# Patient Record
Sex: Male | Born: 1995 | Race: White | Hispanic: No | Marital: Married | Smoking: Current every day smoker
Health system: Southern US, Community
[De-identification: ages and names within clinical notes are randomized; demographics above are authoritative.]

## PROBLEM LIST (undated history)

## (undated) HISTORY — PX: WISDOM TOOTH EXTRACTION: SHX21

---
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
## 2014-05-21 ENCOUNTER — Emergency Department (HOSPITAL_COMMUNITY): Payer: Self-pay

## 2014-05-21 ENCOUNTER — Emergency Department (HOSPITAL_COMMUNITY)
Admission: EM | Admit: 2014-05-21 | Discharge: 2014-05-21 | Disposition: A | Discharge status: Home / Self Care | Payer: Self-pay | Attending: Emergency Medicine | Admitting: Emergency Medicine

## 2014-05-21 ENCOUNTER — Encounter (HOSPITAL_COMMUNITY): Payer: Self-pay | Admitting: Emergency Medicine

## 2014-05-21 DIAGNOSIS — Z720 Tobacco use: Secondary | ICD-10-CM | POA: Insufficient documentation

## 2014-05-21 DIAGNOSIS — R05 Cough: Secondary | ICD-10-CM | POA: Insufficient documentation

## 2014-05-21 DIAGNOSIS — R0789 Other chest pain: Secondary | ICD-10-CM | POA: Insufficient documentation

## 2014-05-21 DIAGNOSIS — R059 Cough, unspecified: Secondary | ICD-10-CM

## 2014-05-21 MED ORDER — CETIRIZINE-PSEUDOEPHEDRINE ER 5-120 MG PO TB12: 1.0000 | ORAL_TABLET | Freq: Every day | ORAL | Status: DC

## 2014-05-21 MED ORDER — NAPROXEN 500 MG PO TABS: 500.0000 mg | ORAL_TABLET | Freq: Two times a day (BID) | ORAL | Status: DC

## 2014-05-21 MED ORDER — FLUTICASONE PROPIONATE 50 MCG/ACT NA SUSP: 2.0000 | Freq: Every day | NASAL | Status: AC

## 2014-05-21 NOTE — ED Provider Notes (Signed)
CSN: [id number]     Arrival date & time 05/21/14  1900 History  This chart was scribed for non-physician practitioner, [name], PA-C working with Glynn OctaveStephen Rancour, MD by Greggory StallionKayla Andersen, ED scribe. This patient was seen in room TR11C/TR11C and the patient's care was started at 8:12 PM.    Chief Complaint  Patient presents with  . Cough   The history is provided by the patient. No language interpreter was used.   HPI Comments: Richard Combs is a 18 y.o. male who presents to the Emergency Department complaining of worsening non productive cough and intermittent SOB that started one month ago. States he has sharp substernal chest pains with coughing episodes. He has history of acid reflux and states this doesn't feel similar. Reports nasal congestion that started one week ago. Denies fever, nausea, emesis. Denies history of asthma. Pt smokes cigarettes daily. Denies leg pain, swelling, recent travel or surgery. No hx of cancer.  No CAD.  No hx of blood clots.    History reviewed. No pertinent past medical history. History reviewed. No pertinent past surgical history. No family history on file. History  Substance Use Topics  . Smoking status: Current Every Day Smoker  . Smokeless tobacco: Not on file  . Alcohol Use: No    Review of Systems  Constitutional: Negative for fever.  HENT: Positive for congestion.   Respiratory: Positive for cough and shortness of breath.   Cardiovascular: Positive for chest pain.  Gastrointestinal: Negative for nausea and vomiting.  All other systems reviewed and are negative.  Allergies  Review of patient's allergies indicates no known allergies.  Home Medications   Prior to Admission medications   Medication Sig Start Date End Date Taking? Authorizing Provider  cetirizine-pseudoephedrine (ZYRTEC-D) 5-120 MG per tablet Take 1 tablet by mouth daily. 05/21/14   [name], PA-C  fluticasone (FLONASE) 50 MCG/ACT nasal spray Place 2 sprays into  both nostrils daily. 05/21/14   [name], PA-C  naproxen (NAPROSYN) 500 MG tablet Take 1 tablet (500 mg total) by mouth 2 (two) times daily. 05/21/14   [name], PA-C   BP 145/76  Pulse 78  Temp(Src) 98.4 F (36.9 C) (Oral)  Resp 19  Ht 5\' 7"  (1.702 m)  Wt 207 lb (93.895 kg)  BMI 32.41 kg/m2  SpO2 98%  Physical Exam  Nursing note and vitals reviewed. Constitutional: He is oriented to person, place, and time. He appears well-developed and well-nourished.  HENT:  Head: Normocephalic and atraumatic.  Mouth/Throat: Oropharynx is clear and moist.  Eyes: EOM are normal.  Neck: Normal range of motion.  Cardiovascular: Normal rate, regular rhythm and normal heart sounds.   Pulmonary/Chest: Effort normal and breath sounds normal. No respiratory distress. He has no wheezes. He has no rhonchi. He has no rales.  Musculoskeletal: Normal range of motion.  Neurological: He is alert and oriented to person, place, and time.  Skin: Skin is warm and dry.  Psychiatric: He has a normal mood and affect. His behavior is normal.    ED Course  Procedures (including critical care time)  DIAGNOSTIC STUDIES: Oxygen Saturation is 100% on RA, normal by my interpretation.    COORDINATION OF CARE: 8:13 PM-Discussed treatment plan which includes chest xray with pt at bedside and pt agreed to plan.   Labs Review Labs Reviewed - No data to display  Imaging Review Dg Chest 2 View  05/21/2014   CLINICAL DATA:  Initial evaluation for cough for 1 month, getting worse, shortness  of breath, cough is nonproductive  EXAM: CHEST  2 VIEW  COMPARISON:  None.  FINDINGS: The heart size and mediastinal contours are within normal limits. Both lungs are clear. The visualized skeletal structures are unremarkable.  IMPRESSION: No active cardiopulmonary disease.   Electronically Signed   By: Esperanza Heiraymond  Rubner M.D.   On: 05/21/2014 21:32     EKG Interpretation None      MDM   Final diagnoses:  Cough  Chest  wall pain    Pt c/o cough with intermittent SOB, centralized chest pain with cough x1 month. Also c/o nasal congestion. On exam, no acute distress, no respiratory distress. Lungs: CTAB. Abd: soft, non-distended, non-tender. No significant PMH. PERC negative.  CXR: no active cardiopulmonary disease.  Will tx for cough with Flonase and zyrtec, may be cough from post-nasal drip due to nasal congestion and duration.  Advised to f/u with PCP in 1-2 weeks for recheck of symptoms. Return precautions provided. Pt verbalized understanding and agreement with tx plan.   I personally performed the services described in this documentation, which was scribed in my presence. The recorded information has been reviewed and is accurate.  [name], PA-C 05/21/14 2218

## 2014-05-21 NOTE — ED Notes (Signed)
Patient transported to X-ray 

## 2014-05-21 NOTE — ED Notes (Signed)
PA at bedside.

## 2014-05-21 NOTE — ED Notes (Signed)
C/o non-productive cough and sob x 1 month.  Reports nasal congestion x 1 week.

## 2014-05-21 NOTE — ED Notes (Signed)
Pt reports cough for one month. Denies congestion. Does report pain with taking deep breath to lower epigastric area.

## 2014-05-21 NOTE — Discharge Instructions (Signed)
Chest Wall Pain °Chest wall pain is pain felt in or around the chest bones and muscles. It may take up to 6 weeks to get better. It may take longer if you are active. Chest wall pain can happen on its own. Other times, things like germs, injury, coughing, or exercise can cause the pain. °HOME CARE  °· Avoid activities that make you tired or cause pain. Try not to use your chest, belly (abdominal), or side muscles. Do not use heavy weights. °· Put ice on the sore area. °¨ Put ice in a plastic bag. °¨ Place a towel between your skin and the bag. °¨ Leave the ice on for 15-20 minutes for the first 2 days. °· Only take medicine as told by your doctor. °GET HELP RIGHT AWAY IF:  °· You have more pain or are very uncomfortable. °· You have a fever. °· Your chest pain gets worse. °· You have new problems. °· You feel sick to your stomach (nauseous) or throw up (vomit). °· You start to sweat or feel lightheaded. °· You have a cough with mucus (phlegm). °· You cough up blood. °MAKE SURE YOU:  °· Understand these instructions. °· Will watch your condition. °· Will get help right away if you are not doing well or get worse. °Document Released: 01/07/2008 Document Revised: 10/13/2011 Document Reviewed: 03/17/2011 °ExitCare® Patient Information ©2015 [employer]. This information is not intended to replace advice given to you by your health care provider. Make sure you discuss any questions you have with your health care provider. ° °

## 2014-05-22 NOTE — ED Provider Notes (Signed)
Medical screening examination/treatment/procedure(s) were performed by non-physician practitioner and as supervising physician I was immediately available for consultation/collaboration.   EKG Interpretation None        Cierra Rothgeb, MD 05/22/14 0003 

## 2014-07-21 ENCOUNTER — Emergency Department (HOSPITAL_COMMUNITY)
Admission: EM | Admit: 2014-07-21 | Discharge: 2014-07-21 | Disposition: A | Discharge status: Home / Self Care | Payer: Self-pay | Attending: Emergency Medicine | Admitting: Emergency Medicine

## 2014-07-21 ENCOUNTER — Encounter (HOSPITAL_COMMUNITY): Payer: Self-pay | Admitting: Emergency Medicine

## 2014-07-21 ENCOUNTER — Emergency Department (HOSPITAL_COMMUNITY): Payer: Self-pay

## 2014-07-21 DIAGNOSIS — Z720 Tobacco use: Secondary | ICD-10-CM | POA: Insufficient documentation

## 2014-07-21 DIAGNOSIS — J40 Bronchitis, not specified as acute or chronic: Secondary | ICD-10-CM | POA: Insufficient documentation

## 2014-07-21 MED ORDER — HYDROCOD POLST-CHLORPHEN POLST 10-8 MG/5ML PO LQCR: 5.0000 mL | Freq: Every evening | ORAL | Status: AC | PRN

## 2014-07-21 MED ORDER — ALBUTEROL SULFATE HFA 108 (90 BASE) MCG/ACT IN AERS
2.0000 | INHALATION_SPRAY | RESPIRATORY_TRACT | Status: DC | PRN
  Administered 2014-07-21: 2 via RESPIRATORY_TRACT
  Filled 2014-07-21: qty 6.7

## 2014-07-21 MED ORDER — NAPROXEN 500 MG PO TABS: 500.0000 mg | ORAL_TABLET | Freq: Two times a day (BID) | ORAL | Status: AC

## 2014-07-21 MED ORDER — CETIRIZINE-PSEUDOEPHEDRINE ER 5-120 MG PO TB12: 1.0000 | ORAL_TABLET | Freq: Every day | ORAL | Status: AC

## 2014-07-21 NOTE — Discharge Instructions (Signed)
Smoking Cessation °Quitting smoking is important to your health and has many advantages. However, it is not always easy to quit since nicotine is a very addictive drug. Oftentimes, people try 3 times or more before being able to quit. This document explains the best ways for you to prepare to quit smoking. Quitting takes hard work and a lot of effort, but you can do it. °ADVANTAGES OF QUITTING SMOKING °· You will live longer, feel better, and live better. °· Your body will feel the impact of quitting smoking almost immediately. °¨ Within 20 minutes, blood pressure decreases. Your pulse returns to its normal level. °¨ After 8 hours, carbon monoxide levels in the blood return to normal. Your oxygen level increases. °¨ After 24 hours, the chance of having a heart attack starts to decrease. Your breath, hair, and body stop smelling like smoke. °¨ After 48 hours, damaged nerve endings begin to recover. Your sense of taste and smell improve. °¨ After 72 hours, the body is virtually free of nicotine. Your bronchial tubes relax and breathing becomes easier. °¨ After 2 to 12 weeks, lungs can hold more air. Exercise becomes easier and circulation improves. °· The risk of having a heart attack, stroke, cancer, or lung disease is greatly reduced. °¨ After 1 year, the risk of coronary heart disease is cut in half. °¨ After 5 years, the risk of stroke falls to the same as a nonsmoker. °¨ After 10 years, the risk of lung cancer is cut in half and the risk of other cancers decreases significantly. °¨ After 15 years, the risk of coronary heart disease drops, usually to the level of a nonsmoker. °· If you are pregnant, quitting smoking will improve your chances of having a healthy baby. °· The people you live with, especially any children, will be healthier. °· You will have extra money to spend on things other than cigarettes. °QUESTIONS TO THINK ABOUT BEFORE ATTEMPTING TO QUIT °You may want to talk about your answers with your  health care provider. °· Why do you want to quit? °· If you tried to quit in the past, what helped and what did not? °· What will be the most difficult situations for you after you quit? How will you plan to handle them? °· Who can help you through the tough times? Your family? Friends? A health care provider? °· What pleasures do you get from smoking? What ways can you still get pleasure if you quit? °Here are some questions to ask your health care provider: °· How can you help me to be successful at quitting? °· What medicine do you think would be best for me and how should I take it? °· What should I do if I need more help? °· What is smoking withdrawal like? How can I get information on withdrawal? °GET READY °· Set a quit date. °· Change your environment by getting rid of all cigarettes, ashtrays, matches, and lighters in your home, car, or work. Do not let people smoke in your home. °· Review your past attempts to quit. Think about what worked and what did not. °GET SUPPORT AND ENCOURAGEMENT °You have a better chance of being successful if you have help. You can get support in many ways. °· Tell your family, friends, and coworkers that you are going to quit and need their support. Ask them not to smoke around you. °· Get individual, group, or telephone counseling and support. Programs are available at local hospitals and health centers. Call   your local health department for information about programs in your area. °· Spiritual beliefs and practices may help some smokers quit. °· Download a "quit meter" on your computer to keep track of quit statistics, such as how long you have gone without smoking, cigarettes not smoked, and money saved. °· Get a self-help book about quitting smoking and staying off tobacco. °LEARN NEW SKILLS AND BEHAVIORS °· Distract yourself from urges to smoke. Talk to someone, go for a walk, or occupy your time with a task. °· Change your normal routine. Take a different route to work.  Drink tea instead of coffee. Eat breakfast in a different place. °· Reduce your stress. Take a hot bath, exercise, or read a book. °· Plan something enjoyable to do every day. Reward yourself for not smoking. °· Explore interactive web-based programs that specialize in helping you quit. °GET MEDICINE AND USE IT CORRECTLY °Medicines can help you stop smoking and decrease the urge to smoke. Combining medicine with the above behavioral methods and support can greatly increase your chances of successfully quitting smoking. °· Nicotine replacement therapy helps deliver nicotine to your body without the negative effects and risks of smoking. Nicotine replacement therapy includes nicotine gum, lozenges, inhalers, nasal sprays, and skin patches. Some may be available over-the-counter and others require a prescription. °· Antidepressant medicine helps people abstain from smoking, but how this works is unknown. This medicine is available by prescription. °· Nicotinic receptor partial agonist medicine simulates the effect of nicotine in your brain. This medicine is available by prescription. °Ask your health care provider for advice about which medicines to use and how to use them based on your health history. Your health care provider will tell you what side effects to look out for if you choose to be on a medicine or therapy. Carefully read the information on the package. Do not use any other product containing nicotine while using a nicotine replacement product.  °RELAPSE OR DIFFICULT SITUATIONS °Most relapses occur within the first 3 months after quitting. Do not be discouraged if you start smoking again. Remember, most people try several times before finally quitting. You may have symptoms of withdrawal because your body is used to nicotine. You may crave cigarettes, be irritable, feel very hungry, cough often, get headaches, or have difficulty concentrating. The withdrawal symptoms are only temporary. They are strongest  when you first quit, but they will go away within 10-14 days. °To reduce the chances of relapse, try to: °· Avoid drinking alcohol. Drinking lowers your chances of successfully quitting. °· Reduce the amount of caffeine you consume. Once you quit smoking, the amount of caffeine in your body increases and can give you symptoms, such as a rapid heartbeat, sweating, and anxiety. °· Avoid smokers because they can make you want to smoke. °· Do not let weight gain distract you. Many smokers will gain weight when they quit, usually less than 10 pounds. Eat a healthy diet and stay active. You can always lose the weight gained after you quit. °· Find ways to improve your mood other than smoking. °FOR MORE INFORMATION  °www.smokefree.gov  °Document Released: 07/15/2001 Document Revised: 12/05/2013 Document Reviewed: 10/30/2011 °ExitCare® Patient Information ©2015 [employer]. This information is not intended to replace advice given to you by your health care provider. Make sure you discuss any questions you have with your health care provider. °Acute Bronchitis °Bronchitis is inflammation of the airways that extend from the windpipe into the lungs (bronchi). The inflammation often causes mucus   to develop. This leads to a cough, which is the most common symptom of bronchitis.  °In acute bronchitis, the condition usually develops suddenly and goes away over time, usually in a couple weeks. Smoking, allergies, and asthma can make bronchitis worse. Repeated episodes of bronchitis may cause further lung problems.  °CAUSES °Acute bronchitis is most often caused by the same virus that causes a cold. The virus can spread from person to person (contagious) through coughing, sneezing, and touching contaminated objects. °SIGNS AND SYMPTOMS  °· Cough.   °· Fever.   °· Coughing up mucus.   °· Body aches.   °· Chest congestion.   °· Chills.   °· Shortness of breath.   °· Sore throat.   °DIAGNOSIS  °Acute bronchitis is usually diagnosed  through a physical exam. Your health care provider will also ask you questions about your medical history. Tests, such as chest X-rays, are sometimes done to rule out other conditions.  °TREATMENT  °Acute bronchitis usually goes away in a couple weeks. Oftentimes, no medical treatment is necessary. Medicines are sometimes given for relief of fever or cough. Antibiotic medicines are usually not needed but may be prescribed in certain situations. In some cases, an inhaler may be recommended to help reduce shortness of breath and control the cough. A cool mist vaporizer may also be used to help thin bronchial secretions and make it easier to clear the chest.  °HOME CARE INSTRUCTIONS °· Get plenty of rest.   °· Drink enough fluids to keep your urine clear or pale yellow (unless you have a medical condition that requires fluid restriction). Increasing fluids may help thin your respiratory secretions (sputum) and reduce chest congestion, and it will prevent dehydration.   °· Take medicines only as directed by your health care provider. °· If you were prescribed an antibiotic medicine, finish it all even if you start to feel better. °· Avoid smoking and secondhand smoke. Exposure to cigarette smoke or irritating chemicals will make bronchitis worse. If you are a smoker, consider using nicotine gum or skin patches to help control withdrawal symptoms. Quitting smoking will help your lungs heal faster.   °· Reduce the chances of another bout of acute bronchitis by washing your hands frequently, avoiding people with cold symptoms, and trying not to touch your hands to your mouth, nose, or eyes.   °· Keep all follow-up visits as directed by your health care provider.   °SEEK MEDICAL CARE IF: °Your symptoms do not improve after 1 week of treatment.  °SEEK IMMEDIATE MEDICAL CARE IF: °· You develop an increased fever or chills.   °· You have chest pain.   °· You have severe shortness of breath. °· You have bloody sputum.   °· You  develop dehydration. °· You faint or repeatedly feel like you are going to pass out. °· You develop repeated vomiting. °· You develop a severe headache. °MAKE SURE YOU:  °· Understand these instructions. °· Will watch your condition. °· Will get help right away if you are not doing well or get worse. °Document Released: 08/28/2004 Document Revised: 12/05/2013 Document Reviewed: 01/11/2013 °ExitCare® Patient Information ©2015 [employer]. This information is not intended to replace advice given to you by your health care provider. Make sure you discuss any questions you have with your health care provider. ° °

## 2014-07-21 NOTE — ED Notes (Addendum)
Pt transported from home c/o productive cough, pt states he was dx with PNA a month ago but did not get meds filled. Pt requesting evaluation  Pt states he did get better from 10/18 visit but s/s returned 11/12. Now  c/o cough, with sore throat.

## 2014-07-21 NOTE — ED Provider Notes (Signed)
CSN: [id number]     Arrival date & time 07/21/14  2107 History  This chart was scribed for Elpidio AnisShari Anntoinette Haefele, PA-C with Purvis SheffieldForrest Harrison, MD by Tonye RoyaltyJoshua Chen, ED Scribe. This patient was seen in room WTR9/WTR9 and the patient's care was started at 11:19 PM.    Chief Complaint  Patient presents with  . Cough   The history is provided by the patient. No language interpreter was used.    HPI Comments: Richard Combs is a 18 y.o. male who presents to the Emergency Department complaining of productive cough with onset 2 weeks ago, worsening gradually. He reports associated congestion in his neck and chest. He denies fever but states he feels hot. He reports prior evaluation here 1 month ago at which time he was diagnosed with pneumonia, after which he improved. He states he has not been taking any medications for his symptoms. He states he has been smoking for 1 year. He denies having similar recurrent symptoms every year. He states his girlfriend is sick with pneumonia, but denies other sick contacts. He states he works as a [employer].   History reviewed. No pertinent past medical history. Past Surgical History  Procedure Laterality Date  . Wisdom tooth extraction     No family history on file. History  Substance Use Topics  . Smoking status: Current Every Day Smoker  . Smokeless tobacco: Not on file  . Alcohol Use: No    Review of Systems  Constitutional: Negative for fever.  HENT: Positive for congestion.   Respiratory: Positive for cough.       Allergies  Review of patient's allergies indicates no known allergies.  Home Medications   Prior to Admission medications   Medication Sig Start Date End Date Taking? Authorizing Provider  cetirizine-pseudoephedrine (ZYRTEC-D) 5-120 MG per tablet Take 1 tablet by mouth daily. 05/21/14   [name], PA-C  fluticasone (FLONASE) 50 MCG/ACT nasal spray Place 2 sprays into both nostrils daily. 05/21/14   [name], PA-C  naproxen  (NAPROSYN) 500 MG tablet Take 1 tablet (500 mg total) by mouth 2 (two) times daily. 05/21/14   [name], PA-C   BP 145/79 mmHg  Pulse 93  Temp(Src) 98.3 F (36.8 C) (Oral)  Resp 16  Ht 5\' 8"  (1.727 m)  Wt 170 lb (77.111 kg)  BMI 25.85 kg/m2  SpO2 99% Physical Exam  Constitutional: He is oriented to person, place, and time. He appears well-developed and well-nourished. No distress.  HENT:  Head: Normocephalic and atraumatic.  Eyes: Conjunctivae are normal.  Cardiovascular: Normal rate and regular rhythm.   No murmur heard. Pulmonary/Chest: Effort normal and breath sounds normal. No respiratory distress. He has no wheezes. He has no rales.  Neurological: He is alert and oriented to person, place, and time.  Psychiatric: He has a normal mood and affect.  Nursing note and vitals reviewed.   ED Course  Procedures (including critical care time) DIAGNOSTIC STUDIES: Oxygen Saturation is 99% on room air, normal by my interpretation.    COORDINATION OF CARE: 11:21 PM Discussed with patient that his chest x-ray reveals no evidence of pneumonia, and his current symptoms are likely viral.   Labs Review Labs Reviewed - No data to display  Imaging Review Dg Chest 2 View  07/21/2014   CLINICAL DATA:  Chest pain and cough for 2 months. History of smoking.  EXAM: CHEST  2 VIEW  COMPARISON:  05/21/2014.  FINDINGS: The heart size and mediastinal contours are within normal limits. Both  lungs are clear. The visualized skeletal structures are unremarkable.  IMPRESSION: No active cardiopulmonary disease.  Stable chest.   Electronically Signed   By: Davonna BellingJohn  Curnes M.D.   On: 07/21/2014 22:37     EKG Interpretation None      MDM   Final diagnoses:  None    1. Bronchitis 2. Tobacco abuse  He is well appearing, afebrile, normal oxygenation. He can be discharged home with supportive care measure recommended.  I personally performed the services described in this documentation, which  was scribed in my presence. The recorded information has been reviewed and is accurate.     Arnoldo HookerShari A Wasif Simonich, PA-C 07/23/14 [id number]  Purvis SheffieldForrest Harrison, MD 07/23/14 [phone]

## 2015-06-04 ENCOUNTER — Emergency Department (HOSPITAL_COMMUNITY): Admission: EM | Admit: 2015-06-04 | Discharge: 2015-06-04 | Discharge status: Absent without leave | Payer: Self-pay

## 2015-06-04 NOTE — ED Notes (Signed)
Called patient to triage and no answer.

## 2015-06-04 NOTE — ED Notes (Signed)
Called patient to triage. Unable to locate patient at this time... 

## 2015-06-04 NOTE — ED Notes (Signed)
Triage documented in error, pt no answer x triage.

## 2015-06-04 NOTE — ED Notes (Deleted)
Pt from home for eval generalized abd pain since Saturday and nausea, pt states she was seen on Saturday for same and given phenergen rx with no relief. Denies any vaginal discharge, or urinary symptoms. Pt hunched over in triage.

## 2015-06-21 IMAGING — CR DG CHEST 2V
2 series · 2 of 2 positions shown · non-contrast
Comparison: 05/21/2014.

CLINICAL DATA: Chest pain and cough for 2 months. History of
smoking.

EXAM:
CHEST  2 VIEW

[w chest pa]
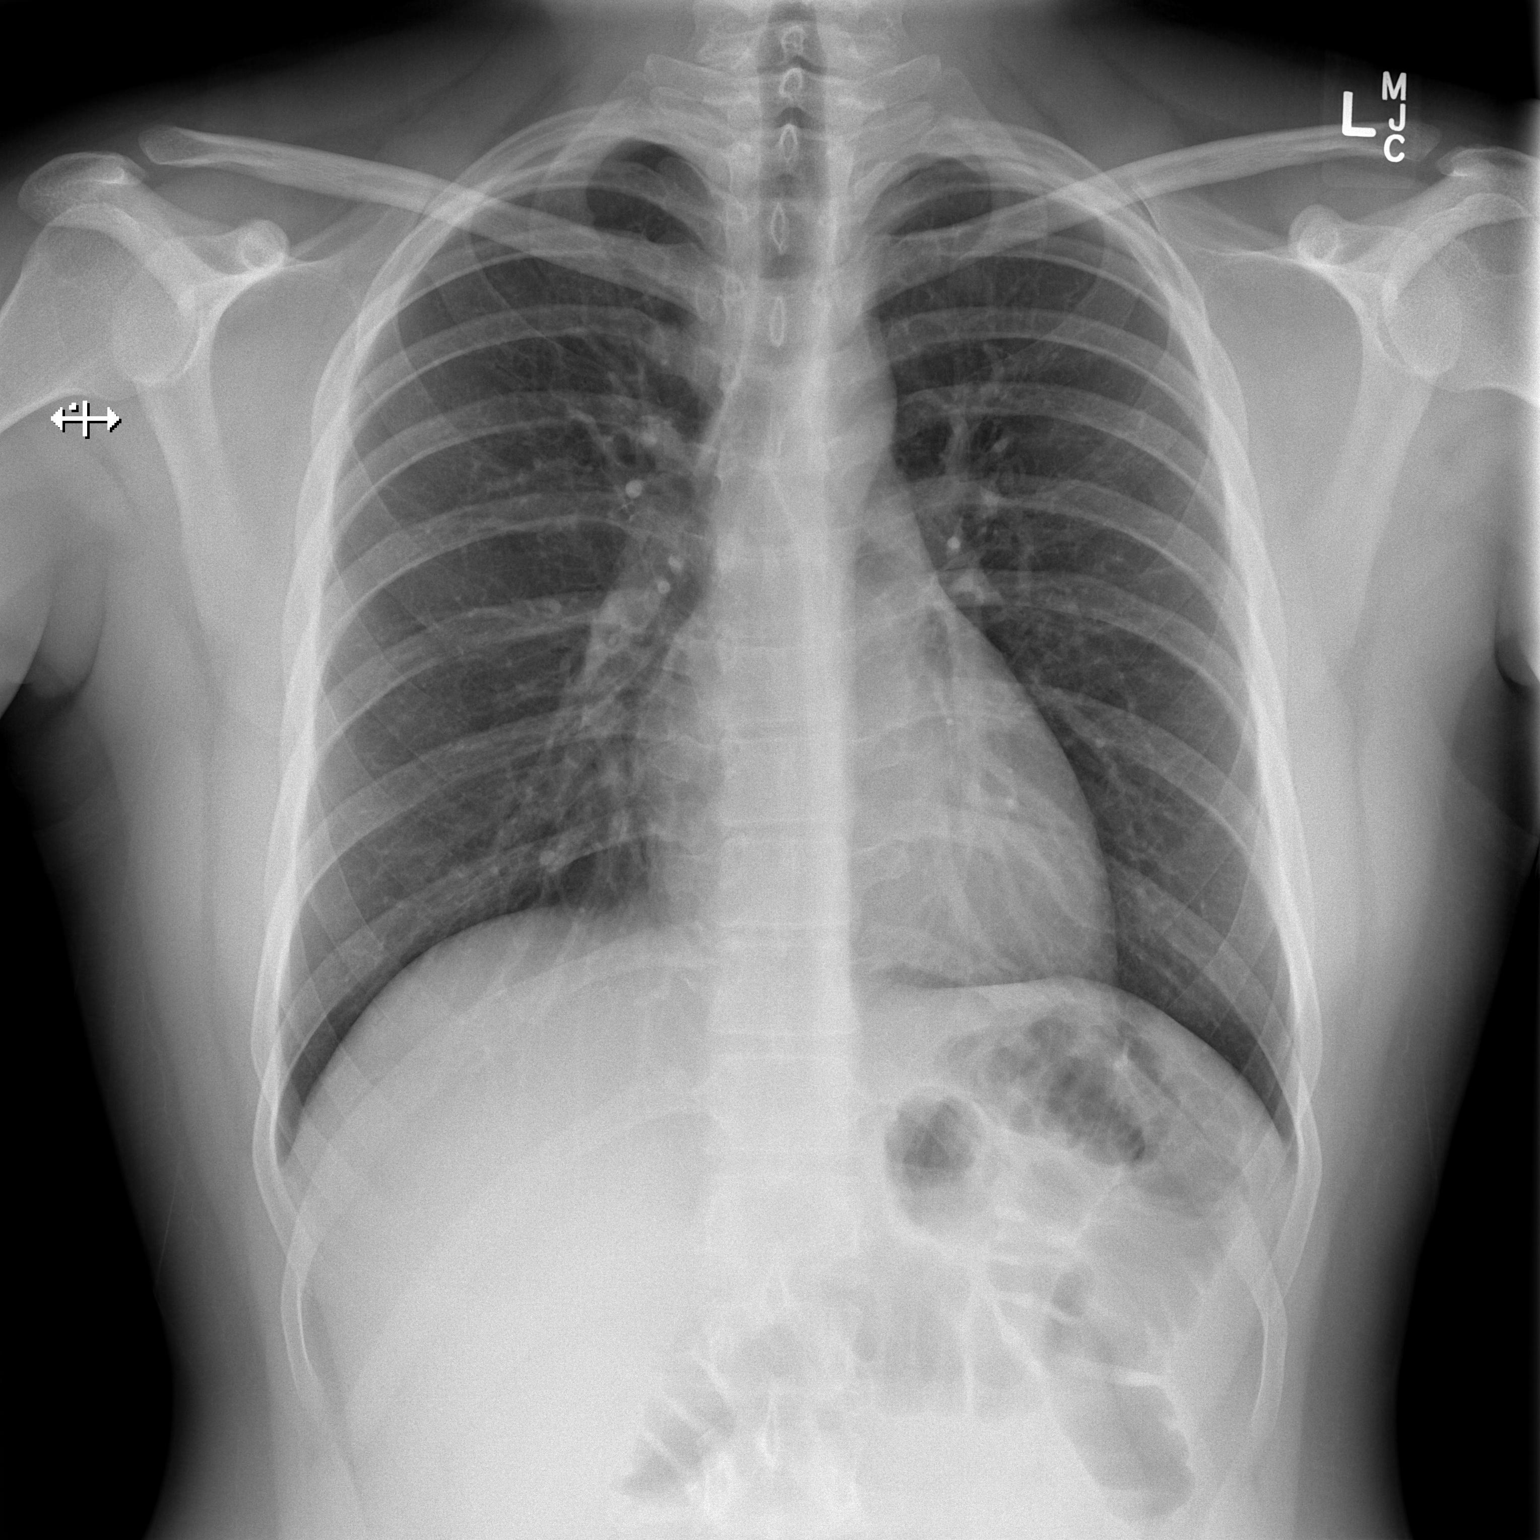

[w chest lat]
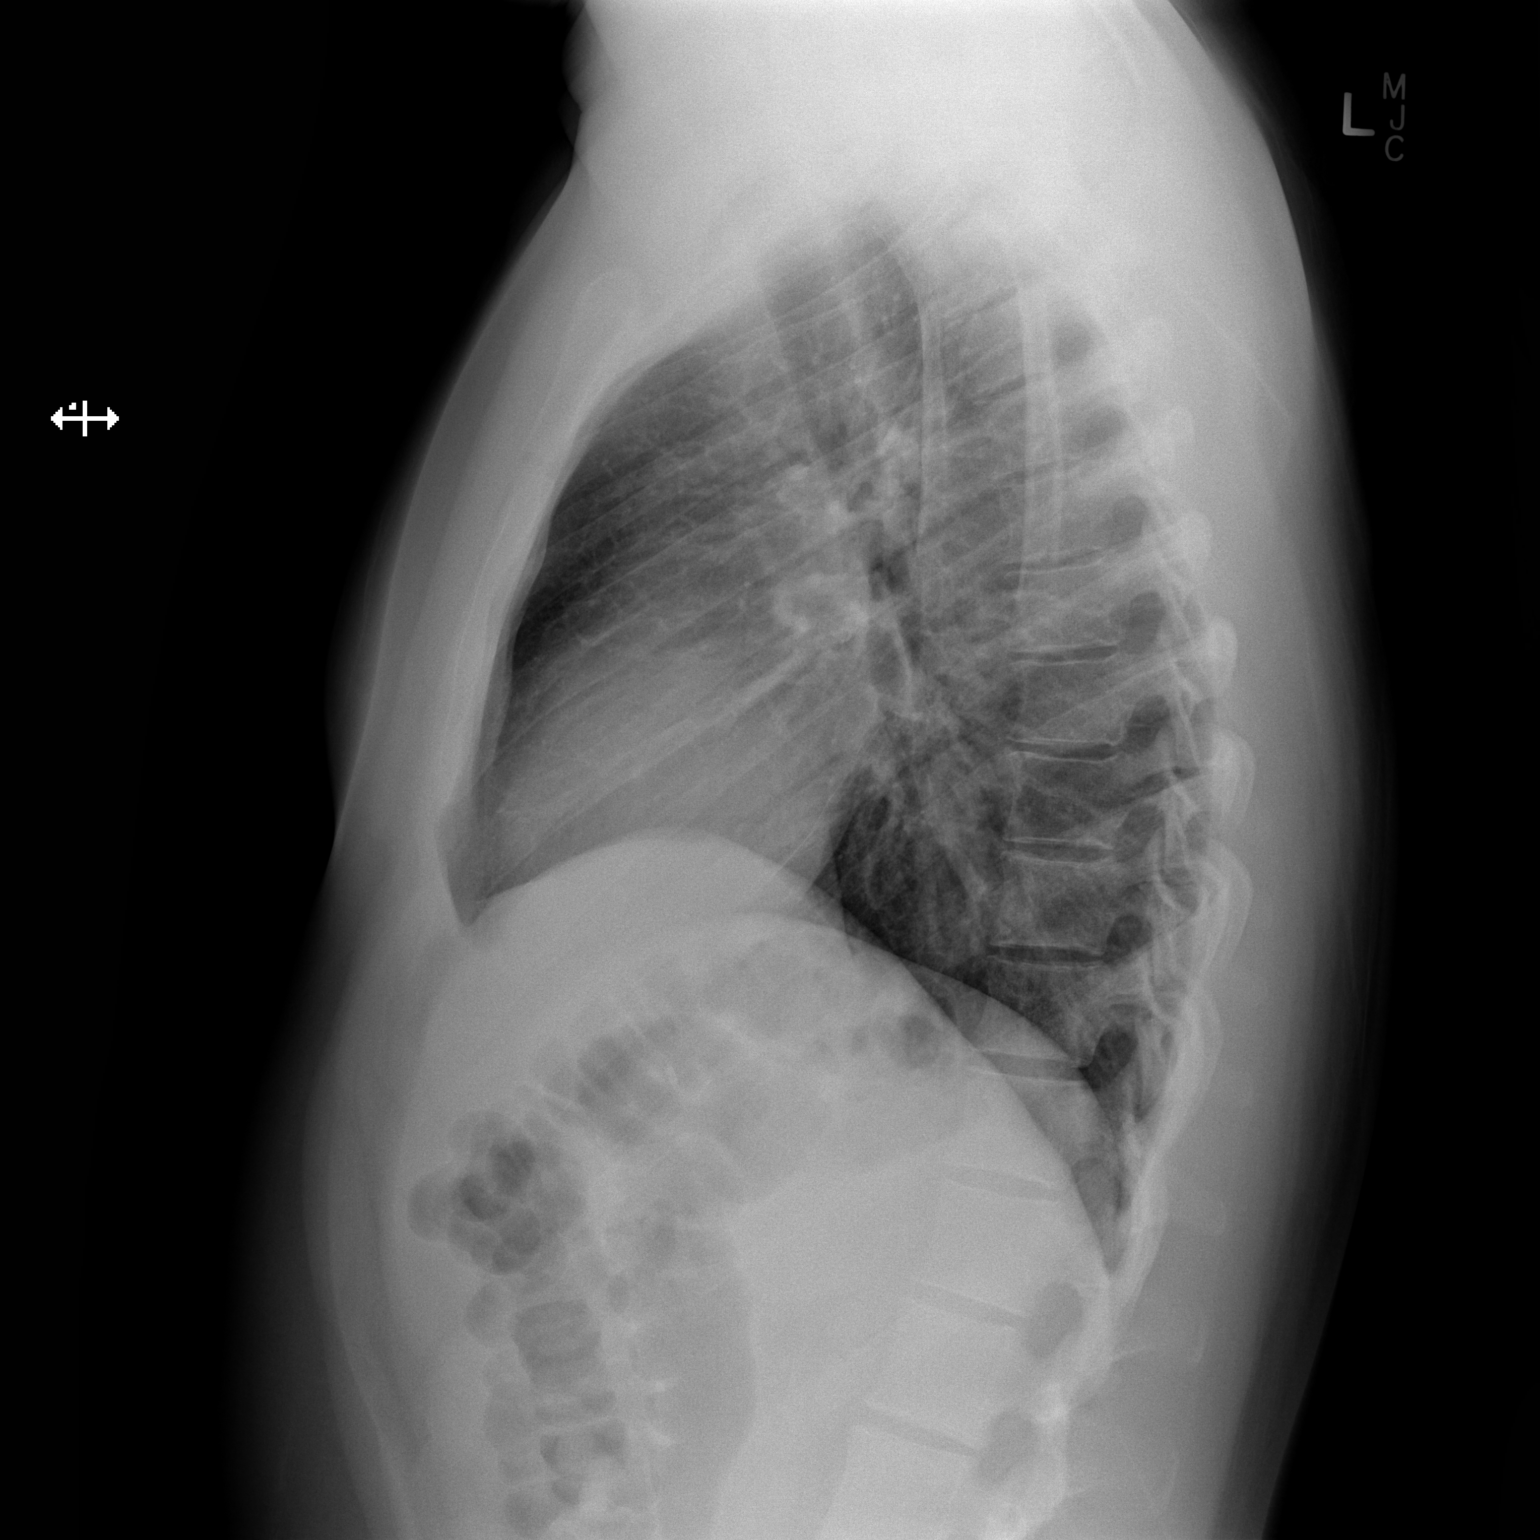

[2 of 2 positions shown; findings below may reference images not displayed]

FINDINGS: The heart size and mediastinal contours are within normal limits.
Both lungs are clear. The visualized skeletal structures are
unremarkable.
IMPRESSION: No active cardiopulmonary disease.  Stable chest.

## 2020-08-21 ENCOUNTER — Other Ambulatory Visit: Payer: Self-pay

## 2020-08-21 ENCOUNTER — Emergency Department (HOSPITAL_COMMUNITY)
Admission: EM | Admit: 2020-08-21 | Discharge: 2020-08-21 | Disposition: A | Discharge status: Home / Self Care | Payer: Self-pay | Attending: Emergency Medicine | Admitting: Emergency Medicine

## 2020-08-21 ENCOUNTER — Emergency Department (HOSPITAL_COMMUNITY): Payer: Self-pay

## 2020-08-21 ENCOUNTER — Encounter (HOSPITAL_COMMUNITY): Payer: Self-pay | Admitting: Emergency Medicine

## 2020-08-21 DIAGNOSIS — M549 Dorsalgia, unspecified: Secondary | ICD-10-CM | POA: Insufficient documentation

## 2020-08-21 DIAGNOSIS — W19XXXA Unspecified fall, initial encounter: Secondary | ICD-10-CM

## 2020-08-21 DIAGNOSIS — F17200 Nicotine dependence, unspecified, uncomplicated: Secondary | ICD-10-CM | POA: Insufficient documentation

## 2020-08-21 DIAGNOSIS — W01198A Fall on same level from slipping, tripping and stumbling with subsequent striking against other object, initial encounter: Secondary | ICD-10-CM | POA: Insufficient documentation

## 2020-08-21 DIAGNOSIS — S61012A Laceration without foreign body of left thumb without damage to nail, initial encounter: Secondary | ICD-10-CM | POA: Insufficient documentation

## 2020-08-21 DIAGNOSIS — S0990XA Unspecified injury of head, initial encounter: Secondary | ICD-10-CM | POA: Insufficient documentation

## 2020-08-21 MED ORDER — ACETAMINOPHEN 325 MG PO TABS
650.0000 mg | ORAL_TABLET | Freq: Once | ORAL | Status: AC
  Administered 2020-08-21: 650 mg via ORAL
  Filled 2020-08-21: qty 2

## 2020-08-21 NOTE — ED Provider Notes (Signed)
[facility] EMERGENCY DEPARTMENT Provider Note   CSN: [id number] Arrival date & time: 08/21/20  1235     History Chief Complaint  Patient presents with  . Fall  . Back Pain  . Head Injury  . Dizziness    Richard Combs is a 25 y.o. male.  HPI   25 year old male presenting the emergency department today for evaluation after a fall.  States that he was walking outside when he slipped on ice and fell backwards onto his back.  States he hit it on a curb.  He also hit the back of his head.  He did not lose consciousness but has had a headache and felt dizzy and nauseated since this occurred.  He denies any visual changes, vomiting, numbness/weakness to the arms or legs.  Denies any loss of control of bowel or bladder function.  He also reports a laceration to his left thumb.  He was seen urgent care prior to arrival into dressing was placed on the thumb and he was sent here for further evaluation of his back and head  History reviewed. No pertinent past medical history.  There are no problems to display for this patient.   Past Surgical History:  Procedure Laterality Date  . WISDOM TOOTH EXTRACTION         No family history on file.  Social History   Tobacco Use  . Smoking status: Current Every Day Smoker  Substance Use Topics  . Alcohol use: No  . Drug use: No    Home Medications Prior to Admission medications   Medication Sig Start Date End Date Taking? Authorizing Provider  cetirizine-pseudoephedrine (ZYRTEC-D) 5-120 MG per tablet Take 1 tablet by mouth daily. 07/21/14   Elpidio Anis, PA-C  chlorpheniramine-HYDROcodone (TUSSIONEX PENNKINETIC ER) 10-8 MG/5ML LQCR Take 5 mLs by mouth at bedtime as needed for cough. 07/21/14   Elpidio Anis, PA-C  fluticasone (FLONASE) 50 MCG/ACT nasal spray Place 2 sprays into both nostrils daily. 05/21/14   Lurene Shadow, PA-C  naproxen (NAPROSYN) 500 MG tablet Take 1 tablet (500 mg total) by mouth 2 (two)  times daily. 07/21/14   Elpidio Anis, PA-C    Allergies    Patient has no known allergies.  Review of Systems   Review of Systems  Eyes: Negative for visual disturbance.  Respiratory: Negative for shortness of breath.   Cardiovascular: Negative for chest pain.  Gastrointestinal: Positive for nausea. Negative for vomiting.  Genitourinary: Negative for flank pain.  Musculoskeletal: Positive for back pain.  Skin: Positive for wound. Negative for rash.  Neurological: Positive for headaches. Negative for weakness and numbness.       Head injury, no loc  All other systems reviewed and are negative.   Physical Exam Updated Vital Signs BP 139/90 (BP Location: Right Arm)   Pulse 100   Temp 98 F (36.7 C) (Oral)   Resp 17   Ht 5\' 10"  (1.778 m)   Wt 117.9 kg   SpO2 99%   BMI 37.31 kg/m   Physical Exam Constitutional:      General: He is not in acute distress.    Appearance: He is well-developed and well-nourished.  HENT:     Head: Normocephalic and atraumatic.  Eyes:     Extraocular Movements: Extraocular movements intact.     Conjunctiva/sclera: Conjunctivae normal.     Pupils: Pupils are equal, round, and reactive to light.  Cardiovascular:     Rate and Rhythm: Normal rate and regular rhythm.  Pulmonary:     Effort: Pulmonary effort is normal.     Breath sounds: Normal breath sounds.  Abdominal:     Palpations: Abdomen is soft.     Tenderness: There is no abdominal tenderness.  Musculoskeletal:     Comments: No TTP to the cervical or lumbar spine. TTP noted to the mid thoracic spine  Skin:    General: Skin is warm and dry.  Neurological:     Mental Status: He is alert and oriented to person, place, and time.     Comments: Mental Status:  Alert, thought content appropriate, able to give a coherent history. Speech fluent without evidence of aphasia. Able to follow 2 step commands without difficulty.  Cranial Nerves:  II:   pupils equal, round, reactive to  light III,IV, VI: ptosis not present, extra-ocular motions intact bilaterally  V,VII: smile symmetric, facial light touch sensation equal VIII: hearing grossly normal to voice  X: uvula elevates symmetrically  XI: bilateral shoulder shrug symmetric and strong XII: midline tongue extension without fassiculations Motor:  Normal tone. 5/5 strength of BUE and BLE major muscle groups including strong and equal grip strength and dorsiflexion/plantar flexion Sensory: light touch normal in all extremities.     ED Results / Procedures / Treatments   Labs (all labs ordered are listed, but only abnormal results are displayed) Labs Reviewed - No data to display  EKG None  Radiology  CLINICAL DATA: Status post fall  EXAM: THORACIC SPINE 2 VIEWS  COMPARISON: None.  FINDINGS: There is no evidence of thoracic spine fracture. Alignment is normal. No other significant bone abnormalities are identified.  IMPRESSION: Negative.   Electronically Signed By: Tish Frederickson M.D. On: 08/21/2020 16:03  CT Head Wo Contrast  Result Date: 08/21/2020 CLINICAL DATA:  Head trauma, moderate to severe.  Fall.  Dizzy. EXAM: CT HEAD WITHOUT CONTRAST TECHNIQUE: Contiguous axial images were obtained from the base of the skull through the vertex without intravenous contrast. COMPARISON:  None. FINDINGS: Brain: No evidence of acute large vascular territory infarction, hemorrhage, hydrocephalus, extra-axial collection or mass lesion/mass effect. Vascular: No hyperdense vessel identified. Skull: No acute fracture. Sinuses/Orbits: Visualized sinuses are clear.  Unremarkable orbits. Other: No mastoid effusions.  Right EAC cerumen. IMPRESSION: No evidence of acute intracranial abnormality. Electronically Signed   By: Feliberto Harts MD   On: 08/21/2020 15:57    Procedures Procedures (including critical care time)  Medications Ordered in ED Medications  acetaminophen (TYLENOL) tablet 650 mg (has no  administration in time range)    ED Course  I have reviewed the triage vital signs and the nursing notes.  Pertinent labs & imaging results that were available during my care of the patient were reviewed by me and considered in my medical decision making (see chart for details).    MDM Rules/Calculators/A&P                          25 year old male here presenting after a mechanical fall.  Sustained head trauma, no LOC.  Normal neuro exam here.  Also complaining of back pain.  No neurodeficits associated with this.  Also has wound to the left thumb that is nonrepairable.  Imaging personally reviewed/interpreted.  CT head shows no acute intracranial abnormality and xray thoracic spine is neg for traumatic injury.  Patient given Tylenol for pain control.  Advised on plan for follow-up and return precautions.  He voices understanding of plan and Reasons to return.  All  questions answered.  Patient stable for discharge.  Final Clinical Impression(s) / ED Diagnoses Final diagnoses:  Fall, initial encounter  Injury of head, initial encounter  Acute back pain, unspecified back location, unspecified back pain laterality    Rx / DC Orders ED Discharge Orders    None       Rayne Du 08/21/20 1653    Milagros Loll, MD 08/22/20 [phone]

## 2020-08-21 NOTE — Discharge Instructions (Signed)

## 2020-08-21 NOTE — ED Notes (Signed)
Patient transported to CT 

## 2020-08-21 NOTE — ED Triage Notes (Addendum)
Onset today slipped on ice fell backwards hit mid to lower back and posterior head. While falling laceration on broken glass left thumb. Seen at Urgent Care treated left thumb no sutures and bandaged. Sent for evaluation for head and back injury along with dizziness since the fall. Alert answering and following commands appropriate. Denies LOC.

## 2021-07-22 IMAGING — CT CT HEAD W/O CM
4 series · 17 of 47 positions shown, 19 images · non-contrast
Comparison: None.

CLINICAL DATA: Head trauma, moderate to severe.  Fall.  Dizzy.

EXAM:
CT HEAD WITHOUT CONTRAST
TECHNIQUE: Contiguous axial images were obtained from the base of the skull
through the vertex without intravenous contrast.

[Series 3: head wo · axial · 0.46mm/px · z∈[+488,+608]mm · 7 of 32 slices shown, 9 images]
[im 4/32  brain]
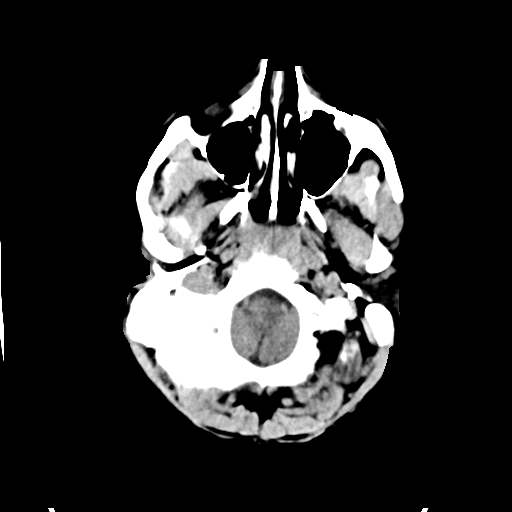
[im 4/32  bone]
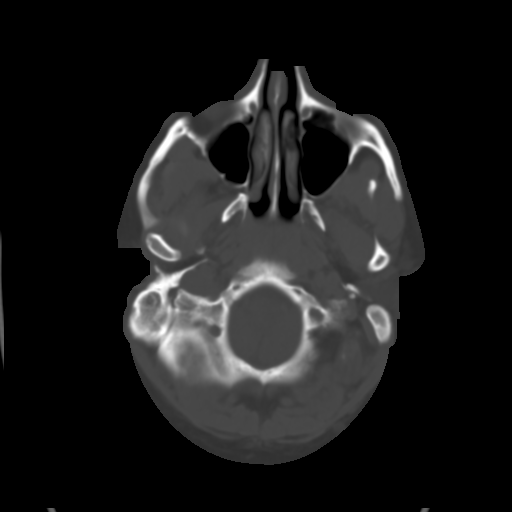
[im 8/32  brain]
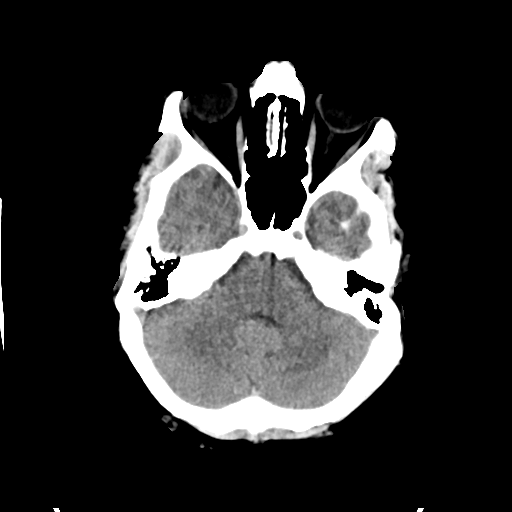
[im 12/32  brain]
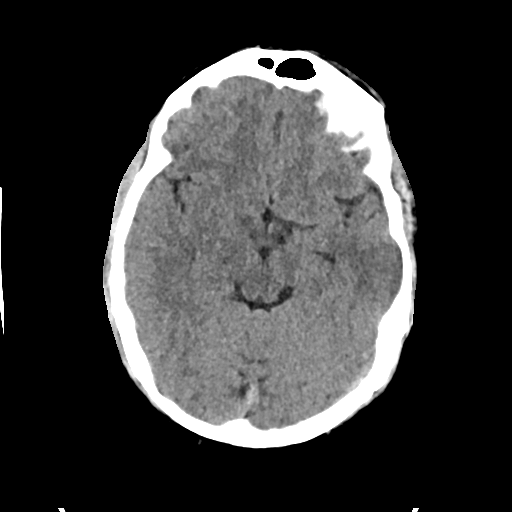
[im 16/32  brain]
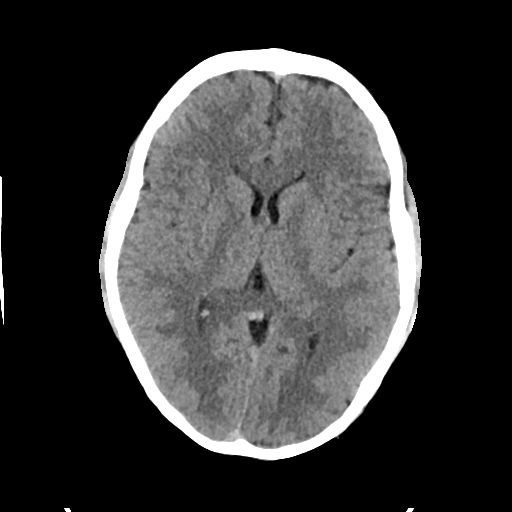
[im 20/32  brain]
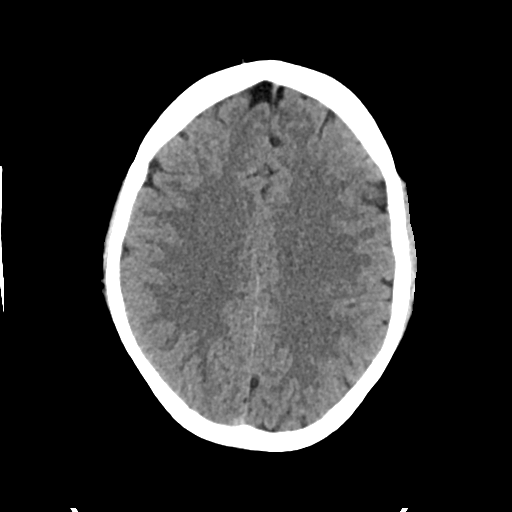
[im 20/32  bone]
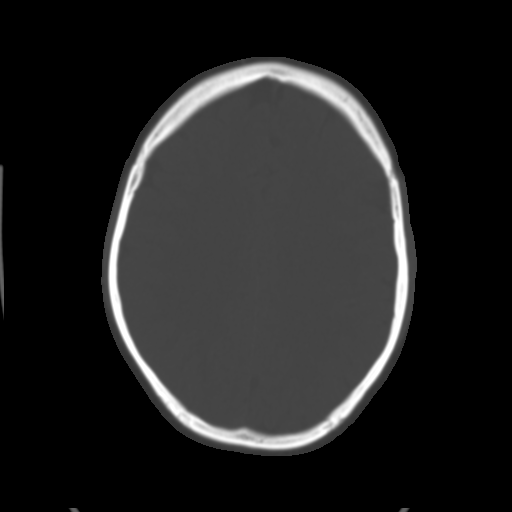
[im 24/32  brain]
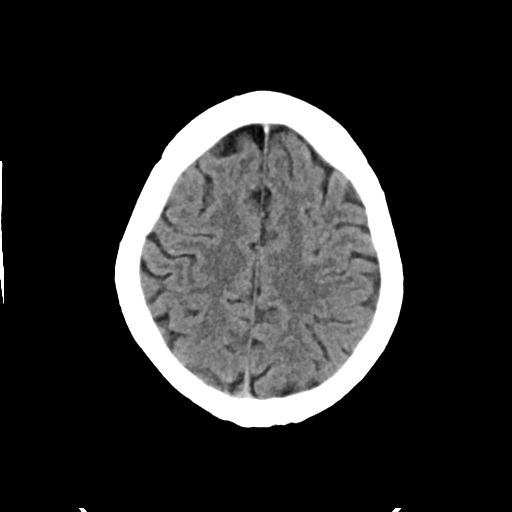
[im 28/32  brain]
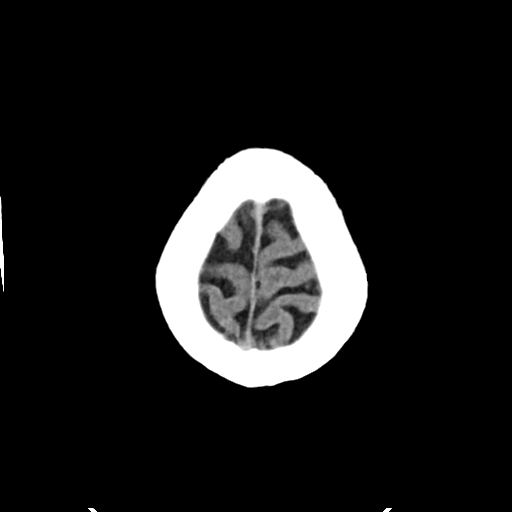

[Series 4: head bone · axial · 0.46mm/px · z∈[+488,+544]mm · 4 of 79 slices shown]
[im 8/79  bone]
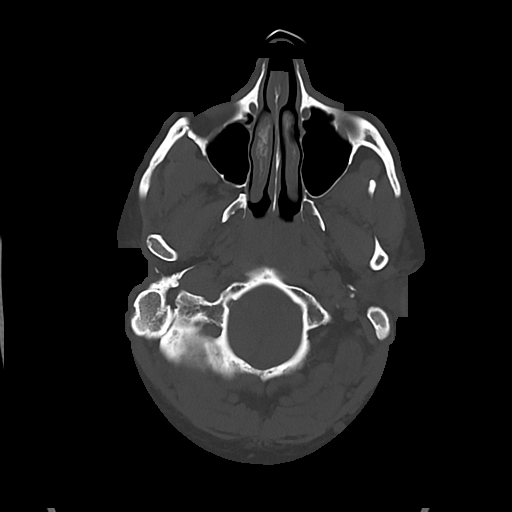
[im 16/79  bone]
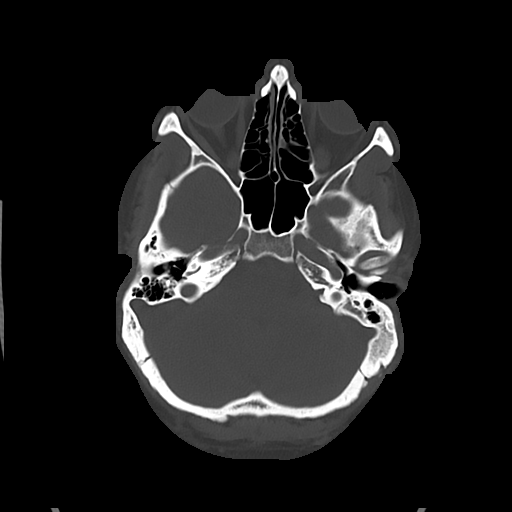
[im 24/79  bone]
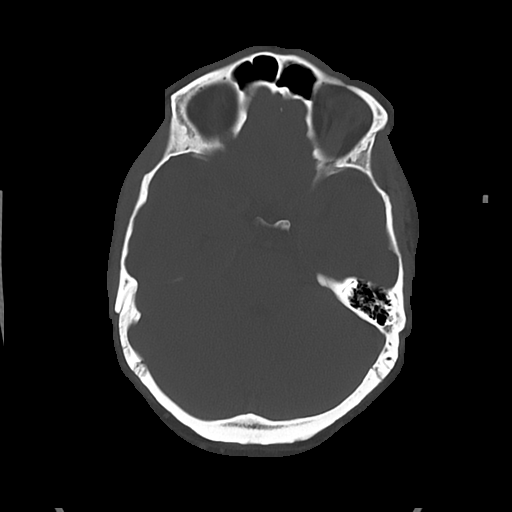
[im 36/79  bone]
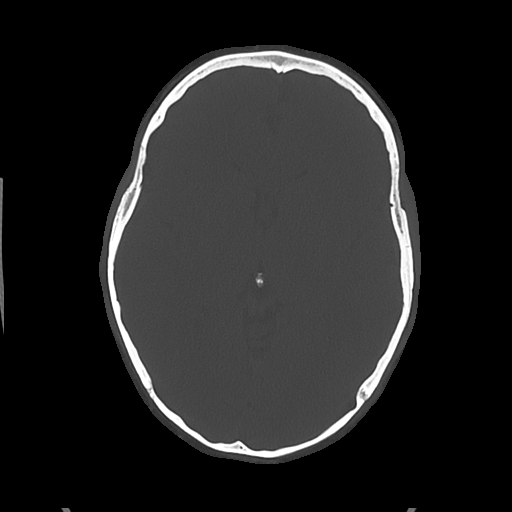

[Series 5: cor soft · coronal · 0.36mm/px · 3 of 71 slices shown]
[im 24/71  brain]
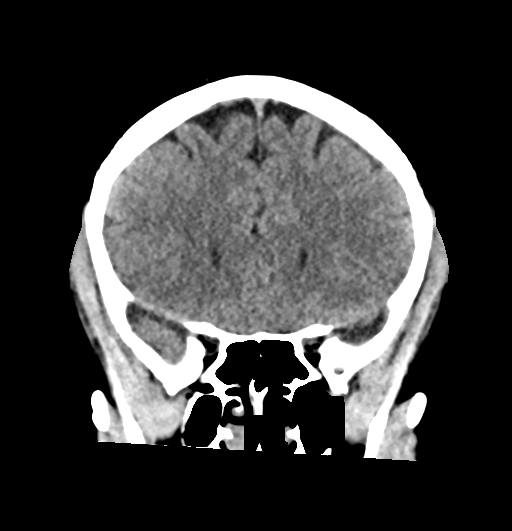
[im 32/71  brain]
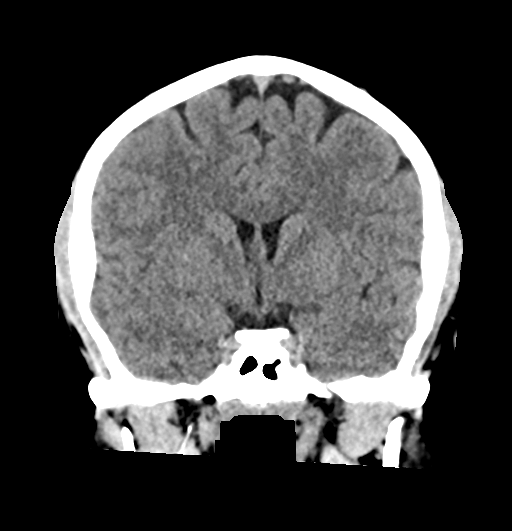
[im 39/71  brain]
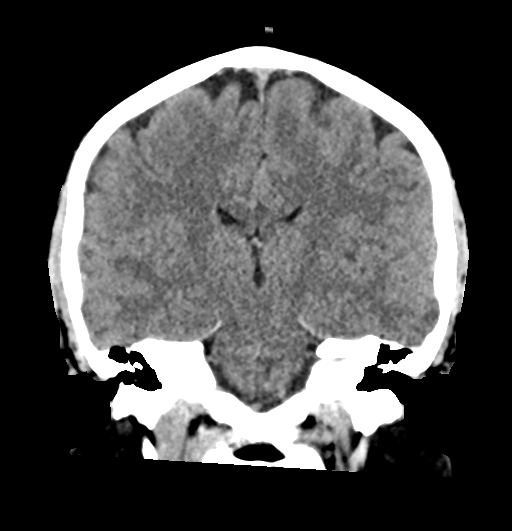

[Series 6: sag soft · sagittal · 0.33mm/px · 3 of 62 slices shown]
[im 21/62  brain]
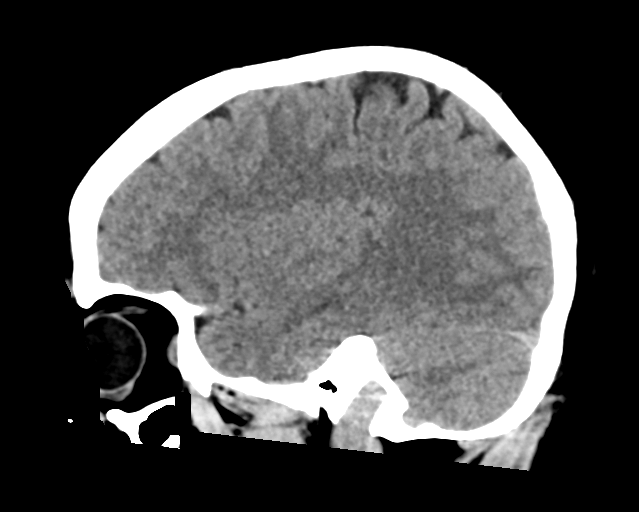
[im 31/62  brain]
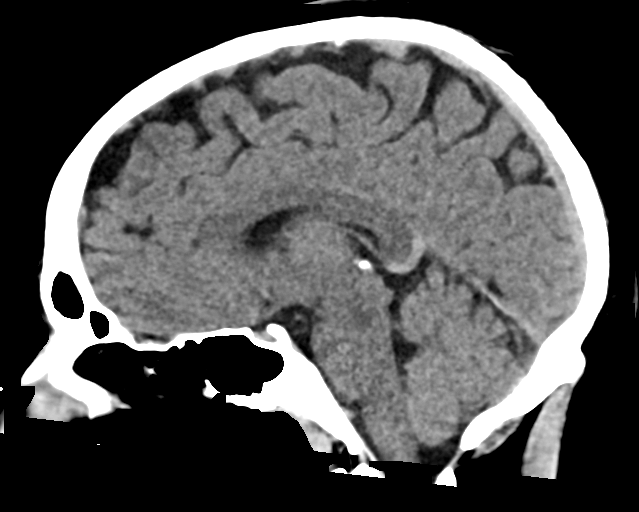
[im 41/62  brain]
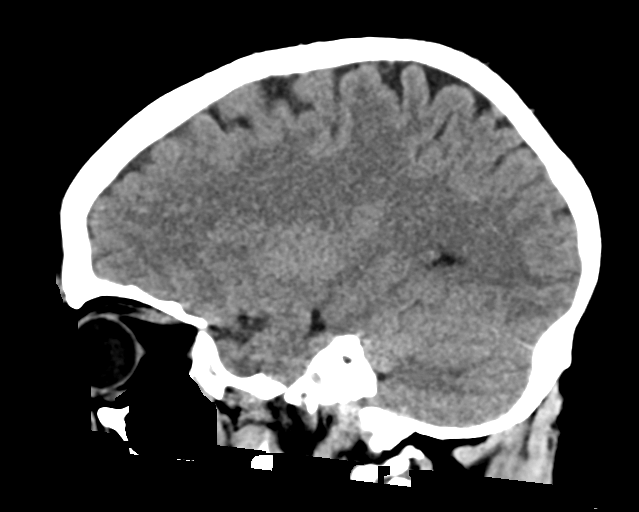

[17 of 47 positions shown; findings below may reference images not displayed]

FINDINGS: Brain: No evidence of acute large vascular territory infarction,
hemorrhage, hydrocephalus, extra-axial collection or mass
lesion/mass effect.

Vascular: No hyperdense vessel identified.

Skull: No acute fracture.

Sinuses/Orbits: Visualized sinuses are clear.  Unremarkable orbits.

Other: No mastoid effusions.  Right EAC cerumen.
IMPRESSION: No evidence of acute intracranial abnormality.

## 2024-06-08 ENCOUNTER — Emergency Department (HOSPITAL_COMMUNITY): Payer: Self-pay

## 2024-06-08 ENCOUNTER — Other Ambulatory Visit: Payer: Self-pay

## 2024-06-08 ENCOUNTER — Emergency Department (HOSPITAL_COMMUNITY)
Admission: EM | Admit: 2024-06-08 | Discharge: 2024-06-08 | Disposition: A | Discharge status: Home / Self Care | Payer: Self-pay | Attending: Emergency Medicine | Admitting: Emergency Medicine

## 2024-06-08 ENCOUNTER — Encounter (HOSPITAL_COMMUNITY): Payer: Self-pay

## 2024-06-08 DIAGNOSIS — M25552 Pain in left hip: Secondary | ICD-10-CM | POA: Diagnosis not present

## 2024-06-08 DIAGNOSIS — S8012XA Contusion of left lower leg, initial encounter: Secondary | ICD-10-CM

## 2024-06-08 DIAGNOSIS — S50812A Abrasion of left forearm, initial encounter: Secondary | ICD-10-CM | POA: Diagnosis not present

## 2024-06-08 DIAGNOSIS — S0081XA Abrasion of other part of head, initial encounter: Secondary | ICD-10-CM | POA: Insufficient documentation

## 2024-06-08 DIAGNOSIS — S0990XA Unspecified injury of head, initial encounter: Secondary | ICD-10-CM | POA: Diagnosis present

## 2024-06-08 DIAGNOSIS — S80212A Abrasion, left knee, initial encounter: Secondary | ICD-10-CM | POA: Insufficient documentation

## 2024-06-08 DIAGNOSIS — Y92410 Unspecified street and highway as the place of occurrence of the external cause: Secondary | ICD-10-CM | POA: Insufficient documentation

## 2024-06-08 DIAGNOSIS — T07XXXA Unspecified multiple injuries, initial encounter: Secondary | ICD-10-CM

## 2024-06-08 MED ORDER — IBUPROFEN 600 MG PO TABS: 600.0000 mg | ORAL_TABLET | Freq: Four times a day (QID) | ORAL | 0 refills | Status: AC | PRN

## 2024-06-08 MED ORDER — HYDROCODONE-ACETAMINOPHEN 5-325 MG PO TABS: 1.0000 | ORAL_TABLET | ORAL | 0 refills | Status: AC | PRN

## 2024-06-08 MED ORDER — IBUPROFEN 800 MG PO TABS
800.0000 mg | ORAL_TABLET | Freq: Once | ORAL | Status: AC
  Administered 2024-06-08: 800 mg via ORAL
  Filled 2024-06-08: qty 1

## 2024-06-08 MED ORDER — HYDROCODONE-ACETAMINOPHEN 5-325 MG PO TABS
1.0000 | ORAL_TABLET | Freq: Once | ORAL | Status: AC
  Administered 2024-06-08: 1 via ORAL
  Filled 2024-06-08: qty 1

## 2024-06-08 MED ORDER — OXYCODONE HCL 5 MG PO TABS
5.0000 mg | ORAL_TABLET | Freq: Once | ORAL | Status: AC
  Administered 2024-06-08: 5 mg via ORAL
  Filled 2024-06-08: qty 1

## 2024-06-08 MED ORDER — BACITRACIN ZINC 500 UNIT/GM EX OINT
TOPICAL_OINTMENT | Freq: Once | CUTANEOUS | Status: AC
Start: 2024-06-08 — End: 2024-06-08
  Filled 2024-06-08: qty 0.9

## 2024-06-08 NOTE — ED Notes (Signed)
 Pt to CT

## 2024-06-08 NOTE — ED Notes (Signed)
 Pt to Xray
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
